# Patient Record
Sex: Female | Born: 1940 | Race: Black or African American | Hispanic: No | Marital: Married | Smoking: Never smoker
Health system: Southern US, Community
[De-identification: ages and names within clinical notes are randomized; demographics above are authoritative.]

## PROBLEM LIST (undated history)

## (undated) DIAGNOSIS — I10 Essential (primary) hypertension: Secondary | ICD-10-CM

## (undated) DIAGNOSIS — E785 Hyperlipidemia, unspecified: Secondary | ICD-10-CM

## (undated) HISTORY — DX: Essential (primary) hypertension: I10

## (undated) HISTORY — DX: Hyperlipidemia, unspecified: E78.5

---
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
## 2010-08-30 ENCOUNTER — Other Ambulatory Visit: Payer: Self-pay | Admitting: Internal Medicine

## 2010-08-30 DIAGNOSIS — Z1231 Encounter for screening mammogram for malignant neoplasm of breast: Secondary | ICD-10-CM

## 2010-08-30 DIAGNOSIS — Z780 Asymptomatic menopausal state: Secondary | ICD-10-CM

## 2010-09-07 ENCOUNTER — Ambulatory Visit
Admission: RE | Admit: 2010-09-07 | Discharge: 2010-09-07 | Disposition: A | Discharge status: Home / Self Care | Payer: Medicare Other | Source: Ambulatory Visit | Attending: Internal Medicine | Admitting: Internal Medicine

## 2010-09-07 DIAGNOSIS — Z780 Asymptomatic menopausal state: Secondary | ICD-10-CM

## 2010-09-07 DIAGNOSIS — Z1231 Encounter for screening mammogram for malignant neoplasm of breast: Secondary | ICD-10-CM

## 2014-05-17 DIAGNOSIS — E780 Pure hypercholesterolemia: Secondary | ICD-10-CM | POA: Diagnosis not present

## 2014-05-17 DIAGNOSIS — E559 Vitamin D deficiency, unspecified: Secondary | ICD-10-CM | POA: Diagnosis not present

## 2014-05-17 DIAGNOSIS — E119 Type 2 diabetes mellitus without complications: Secondary | ICD-10-CM | POA: Diagnosis not present

## 2014-05-17 DIAGNOSIS — I10 Essential (primary) hypertension: Secondary | ICD-10-CM | POA: Diagnosis not present

## 2014-09-13 DIAGNOSIS — E559 Vitamin D deficiency, unspecified: Secondary | ICD-10-CM | POA: Diagnosis not present

## 2014-09-13 DIAGNOSIS — E119 Type 2 diabetes mellitus without complications: Secondary | ICD-10-CM | POA: Diagnosis not present

## 2014-09-13 DIAGNOSIS — E780 Pure hypercholesterolemia: Secondary | ICD-10-CM | POA: Diagnosis not present

## 2014-09-13 DIAGNOSIS — M259 Joint disorder, unspecified: Secondary | ICD-10-CM | POA: Diagnosis not present

## 2014-09-13 DIAGNOSIS — I10 Essential (primary) hypertension: Secondary | ICD-10-CM | POA: Diagnosis not present

## 2015-01-10 DIAGNOSIS — I10 Essential (primary) hypertension: Secondary | ICD-10-CM | POA: Diagnosis not present

## 2015-01-10 DIAGNOSIS — Z23 Encounter for immunization: Secondary | ICD-10-CM | POA: Diagnosis not present

## 2015-01-10 DIAGNOSIS — E559 Vitamin D deficiency, unspecified: Secondary | ICD-10-CM | POA: Diagnosis not present

## 2015-01-10 DIAGNOSIS — E781 Pure hyperglyceridemia: Secondary | ICD-10-CM | POA: Diagnosis not present

## 2015-01-10 DIAGNOSIS — E119 Type 2 diabetes mellitus without complications: Secondary | ICD-10-CM | POA: Diagnosis not present

## 2015-05-09 DIAGNOSIS — Z0001 Encounter for general adult medical examination with abnormal findings: Secondary | ICD-10-CM | POA: Diagnosis not present

## 2015-11-17 DIAGNOSIS — H04123 Dry eye syndrome of bilateral lacrimal glands: Secondary | ICD-10-CM | POA: Diagnosis not present

## 2015-11-17 DIAGNOSIS — H2513 Age-related nuclear cataract, bilateral: Secondary | ICD-10-CM | POA: Diagnosis not present

## 2015-11-17 DIAGNOSIS — H25013 Cortical age-related cataract, bilateral: Secondary | ICD-10-CM | POA: Diagnosis not present

## 2015-11-17 DIAGNOSIS — H40023 Open angle with borderline findings, high risk, bilateral: Secondary | ICD-10-CM | POA: Diagnosis not present

## 2015-11-17 DIAGNOSIS — H40033 Anatomical narrow angle, bilateral: Secondary | ICD-10-CM | POA: Diagnosis not present

## 2015-12-05 DIAGNOSIS — H40033 Anatomical narrow angle, bilateral: Secondary | ICD-10-CM | POA: Diagnosis not present

## 2015-12-05 DIAGNOSIS — H25813 Combined forms of age-related cataract, bilateral: Secondary | ICD-10-CM | POA: Diagnosis not present

## 2016-01-23 DIAGNOSIS — M2550 Pain in unspecified joint: Secondary | ICD-10-CM | POA: Diagnosis not present

## 2016-01-23 DIAGNOSIS — E559 Vitamin D deficiency, unspecified: Secondary | ICD-10-CM | POA: Diagnosis not present

## 2016-01-23 DIAGNOSIS — I10 Essential (primary) hypertension: Secondary | ICD-10-CM | POA: Diagnosis not present

## 2016-01-23 DIAGNOSIS — E781 Pure hyperglyceridemia: Secondary | ICD-10-CM | POA: Diagnosis not present

## 2016-01-23 DIAGNOSIS — E119 Type 2 diabetes mellitus without complications: Secondary | ICD-10-CM | POA: Diagnosis not present

## 2016-07-31 ENCOUNTER — Other Ambulatory Visit: Payer: Self-pay | Admitting: Internal Medicine

## 2016-07-31 DIAGNOSIS — E2839 Other primary ovarian failure: Secondary | ICD-10-CM

## 2016-07-31 DIAGNOSIS — Z1231 Encounter for screening mammogram for malignant neoplasm of breast: Secondary | ICD-10-CM

## 2016-08-19 ENCOUNTER — Ambulatory Visit
Admission: RE | Admit: 2016-08-19 | Discharge: 2016-08-19 | Disposition: A | Discharge status: Home / Self Care | Payer: Medicare Other | Source: Ambulatory Visit | Attending: Internal Medicine | Admitting: Internal Medicine

## 2016-08-19 DIAGNOSIS — Z1231 Encounter for screening mammogram for malignant neoplasm of breast: Secondary | ICD-10-CM

## 2016-08-19 DIAGNOSIS — E2839 Other primary ovarian failure: Secondary | ICD-10-CM

## 2018-07-09 ENCOUNTER — Ambulatory Visit: Payer: Medicare Other | Admitting: *Deleted

## 2018-07-14 ENCOUNTER — Encounter: Payer: Medicare Other | Attending: Family Medicine | Admitting: Registered"

## 2018-07-14 ENCOUNTER — Encounter: Payer: Self-pay | Admitting: Registered"

## 2018-07-14 DIAGNOSIS — E1165 Type 2 diabetes mellitus with hyperglycemia: Secondary | ICD-10-CM | POA: Insufficient documentation

## 2018-07-14 NOTE — Patient Instructions (Addendum)
To help you work through your feelings of grief, consider talking to a therapist.  Review the Foot Care handout to ensure you are doing proper care.  Continue getting outside to walk as tolerated. When the weather doesn't permit going outside consider dancing inside or trying some of the arm chair exercises.  Aim to eat balanced meals and snacks. You can use the snack sheet for ideas. Remember to include protein when eating fruit, cottage cheese tastes good with pineapple and other fruits.  Good plan to change what you drink to mostly water. Most of the other things you are currently drink have a lot of sugar and are probably causing your blood sugar to spike. Instead of cranberry juice you can take cranberry pills to do the same thing for urinary health.  Consider checking your blood sugar 2 hours after some meals to give your doctor good information when managing your medications.   In the book you are writing your meals in, also include your blood sugar readings to help you see how you might make some changes to help manage your blood sugar.

## 2018-07-14 NOTE — Progress Notes (Signed)
This visit was completed via telephone due to the COVID-19 pandemic.   I spoke with Sherry Hanson and verified that I was speaking with the correct person with two patient identifiers (full name and date of birth).   I discussed the limitations related to this kind of visit and the patient is willing to proceed.  Diabetes Self-Management Education  Visit Type: First/Initial  Appt. Start Time: 1530 Appt. End Time: 1630  07/14/2018  [name], identified by name and date of birth, is a 78 y.o. female with a diagnosis of Diabetes: Type 2.   ASSESSMENT  There were no vitals taken for this visit. There is no height or weight on file to calculate BMI.   Pt states she was concerned about taking the pravastatin Sodium because she thought it was a salt pill and she would get too much sodium.  Pt checks BG sometimes 45 min PPBG 170 and before bed recently was 200 mg/dL. Pt states she wore a CGM for awhile and it revealed highest BG is after lunch meal.  Pt states she was not aware lemonade and cranberry juice were  ehigh in sugar and states she intends to switch to water now.   Pt states she is not hungry in the morning, has coffee while watching TV and then has bowl of cereal with banana. Pt states her appetite is not good sometimes and doesn't know what she wants to eat and drinks pickle juice to increase appetite.   Pt states her church sends food out to her to help during [place] stay at home orders. Pt states she just eats what they send and feels it has too much salt. Pt states they also give her fruit & vegetable boxes. Pt states before COVID-19 her neighbor who does catering often shares leftovers with her. Pt states she quit eating ice cream and french fries.  Pt states she has dentures but doesn't use them for chewing because they don't fit right. Pt states she is able to eat most foods without teeth as long as it is cut up.  Stress: Pt states she is afraid of everything with  COVID-19. Pt states she tries to deal with things herself but finds it hard to deal with grief with so many friends and family dying. Pt states she still has her sisters and talks to them.   Pt states she has a cataract in one eye but afraid to have it operated on.  Pt states she drinks cranberry juice for both taste and to help prevent UTI. Pt states she also takes AZO products sometimes. I told her they have a variety of products.  Activity: walks to store, plans to increase days per week getting out. Pt states she really likes to dance, will do at home, church, house parties.  Diabetes Self-Management Education - 07/14/18 1539      Visit Information   Visit Type  First/Initial      Initial Visit   Diabetes Type  Type 2    Are you currently following a meal plan?  No    Are you taking your medications as prescribed?  Yes   lantus 70 u, actos   Date Diagnosed  1992      Health Coping   How would you rate your overall health?  Fair      Psychosocial Assessment   Patient Belief/Attitude about Diabetes  Other (comment)   afraid of everything   How often do you need to have someone  help you when you read instructions, pamphlets, or other written materials from your doctor or pharmacy?  1 - Never    What is the last grade level you completed in school?  some college      Complications   Last HgB A1C per patient/outside source  13.6 %   05/15/2018 per referral   How often do you check your blood sugar?  1-2 times/day    Number of hypoglycemic episodes per month  0    Have you had a dilated eye exam in the past 12 months?  Yes    Have you had a dental exam in the past 12 months?  No   has dentures   Are you checking your feet?  Yes    How many days per week are you checking your feet?  7      Dietary Intake   Breakfast  coffee, cream & 1 T sugar, honey nut cheerios, bananas   9   Lunch  tuna fish OR BLT, baked beans, cole slaw, mac cheese, lemonade     Snack (afternoon)  lance  cheese crackers OR fruit    Dinner  lamb chops, salad, baked potato & beets    Snack (evening)  cheese crackers    Beverage(s)  water, tea, coffee, cranberry      Exercise   Exercise Type  Light (walking / raking leaves)    How many days per week to you exercise?  3    How many minutes per day do you exercise?  30    Total minutes per week of exercise  90      Patient Education   Previous Diabetes Education  No    Nutrition management   Role of diet in the treatment of diabetes and the relationship between the three main macronutrients and blood glucose level;Other (comment)   amt of sugar in beverages   Physical activity and exercise   Role of exercise on diabetes management, blood pressure control and cardiac health.    Monitoring  Purpose and frequency of SMBG.    Chronic complications  Assessed and discussed foot care and prevention of foot problems    Psychosocial adjustment  Role of stress on diabetes      Individualized Goals (developed by patient)   Nutrition  General guidelines for healthy choices and portions discussed    Physical Activity  Exercise 3-5 times per week    Monitoring   test my blood glucose as discussed    Health Coping  ask for help with (comment)   processing grief      Outcomes   Expected Outcomes  Demonstrated interest in learning. Expect positive outcomes    Future DMSE  4-6 wks    Program Status  Completed       Individualized Plan for Diabetes Self-Management Training:   Learning Objective:  Patient will have a greater understanding of diabetes self-management. Patient education plan is to attend individual and/or group sessions per assessed needs and concerns.   Patient Instructions  To help you work through your feelings of grief, consider talking to a therapist. Review the Foot Care handout to ensure you are doing proper care. Continue getting outside to walk as tolerated. When the weather doesn't permit going outside consider dancing  inside or trying some of the arm chair exercises. Aim to eat balanced meals and snacks. You can use the snack sheet for ideas. Remember to include protein when eating fruit, cottage cheese tastes good  with pineapple and other fruits. Good plan to change what you drink to mostly water. Most of the other things you are currently drink have a lot of sugar and are probably causing your blood sugar to spike.  Consider checking your blood sugar 2 hours after some meals to give your doctor good information when managing your medications.  In the book you are writing your meals in, also include your blood sugar readings to help you see how you might make some changes to help manage your blood sugar.   Expected Outcomes:  Demonstrated interest in learning. Expect positive outcomes  Education material provided: My Plate and Snack sheet, foot care, arm chair exercises  If problems or questions, patient to contact team via:  Phone  Future DSME appointment: 4-6 wks

## 2018-10-27 ENCOUNTER — Other Ambulatory Visit (HOSPITAL_COMMUNITY): Payer: Self-pay | Admitting: Family Medicine

## 2018-10-27 DIAGNOSIS — R6889 Other general symptoms and signs: Secondary | ICD-10-CM

## 2018-10-28 ENCOUNTER — Other Ambulatory Visit: Payer: Self-pay

## 2018-10-28 ENCOUNTER — Ambulatory Visit (HOSPITAL_COMMUNITY)
Admission: RE | Admit: 2018-10-28 | Discharge: 2018-10-28 | Disposition: A | Discharge status: Home / Self Care | Payer: Medicare Other | Source: Ambulatory Visit | Attending: Family | Admitting: Family

## 2018-10-28 DIAGNOSIS — R6889 Other general symptoms and signs: Secondary | ICD-10-CM | POA: Diagnosis not present

## 2018-12-10 ENCOUNTER — Encounter: Payer: Self-pay | Admitting: Vascular Surgery

## 2018-12-10 ENCOUNTER — Ambulatory Visit: Payer: Medicare Other | Admitting: Vascular Surgery

## 2018-12-10 ENCOUNTER — Other Ambulatory Visit: Payer: Self-pay

## 2018-12-10 VITALS — BP 118/74 | HR 74 | Temp 97.5°F | Resp 18 | Ht 65.5 in | Wt 137.0 lb

## 2018-12-10 DIAGNOSIS — I739 Peripheral vascular disease, unspecified: Secondary | ICD-10-CM

## 2018-12-10 NOTE — Progress Notes (Signed)
Referring Physician: Dr Juluis Rainier  Patient name: CHRISTINE MORTON MRN: [id number] DOB: Nov 03, 1940 Sex: female  REASON FOR CONSULT: Abnormal pulse exam  HPI: Sherry Hanson is a 78 y.o. female, sent for evaluation of an abnormal pulse exam.  She apparently had an abnormal pulse exam by her home health nurse and bilateral ABIs were obtained.  These were done a few weeks ago.  ABI on the right was 0.9 left was 0.8 this was dated October 28, 2018.  Patient does not have any symptoms of claudication.  She has no nonhealing wounds.  She is essentially asymptomatic.  Chronic medical problems including hyperlipidemia hypertension are both stable.  She is a non-smoker.     Past Medical History:  Diagnosis Date  . Hyperlipidemia   . Hypertension    Past Surgical History:  Procedure Laterality Date  . BREAST EXCISIONAL BIOPSY Right 1961    Family History  Problem Relation Age of Onset  . Breast cancer Neg Hx     SOCIAL HISTORY: Social History   Socioeconomic History  . Marital status: Married    Spouse name: Not on file  . Number of children: Not on file  . Years of education: Not on file  . Highest education level: Not on file  Occupational History  . Not on file  Social Needs  . Financial resource strain: Not on file  . Food insecurity    Worry: Not on file    Inability: Not on file  . Transportation needs    Medical: Not on file    Non-medical: Not on file  Tobacco Use  . Smoking status: Never Smoker  . Smokeless tobacco: Never Used  Substance and Sexual Activity  . Alcohol use: Not Currently  . Drug use: Never  . Sexual activity: Not on file  Lifestyle  . Physical activity    Days per week: Not on file    Minutes per session: Not on file  . Stress: Not on file  Relationships  . Social Musician on phone: Not on file    Gets together: Not on file    Attends religious service: Not on file    Active member of club or organization: Not on file    Attends meetings of clubs or organizations: Not on file    Relationship status: Not on file  . Intimate partner violence    Fear of current or ex partner: Not on file    Emotionally abused: Not on file    Physically abused: Not on file    Forced sexual activity: Not on file  Other Topics Concern  . Not on file  Social History Narrative  . Not on file    No Known Allergies  Current Outpatient Medications  Medication Sig Dispense Refill  . amLODipine (NORVASC) 10 MG tablet Take 10 mg by mouth daily.    Marland Kitchen aspirin EC 81 MG tablet Take 81 mg by mouth daily.    . Atorvastatin Calcium (LIPITOR PO) Take by mouth.    . Ergocalciferol (VITAMIN D2) 50 MCG (2000 UT) TABS Take by mouth.    . hydrochlorothiazide (HYDRODIURIL) 25 MG tablet Take 25 mg by mouth daily.    . Insulin Glargine (LANTUS [place]) Inject 70 Units into the skin.    Marland Kitchen LISINOPRIL PO Take by mouth.    . metoprolol tartrate (LOPRESSOR) 50 MG tablet Take 50 mg by mouth 2 (two) times daily.    . pioglitazone (ACTOS)  30 MG tablet Take 30 mg by mouth daily.     No current facility-administered medications for this visit.     ROS:   General:  No weight loss, Fever, chills  HEENT: No recent headaches, no nasal bleeding, no visual changes, no sore throat  Neurologic: No dizziness, blackouts, seizures. No recent symptoms of stroke or mini- stroke. No recent episodes of slurred speech, or temporary blindness.  Cardiac: No recent episodes of chest pain/pressure, no shortness of breath at rest.  No shortness of breath with exertion.  Denies history of atrial fibrillation or irregular heartbeat  Vascular: No history of rest pain in feet.  No history of claudication.  No history of non-healing ulcer, No history of DVT   Pulmonary: No home oxygen, no productive cough, no hemoptysis,  No asthma or wheezing  Musculoskeletal:  [ ]  Arthritis, [ ]  Low back pain,  [ ]  Joint pain  Hematologic:No history of hypercoagulable state.  No history  of easy bleeding.  No history of anemia  Gastrointestinal: No hematochezia or melena,  No gastroesophageal reflux, no trouble swallowing  Urinary: [ ]  chronic Kidney disease, [ ]  on HD - [ ]  MWF or [ ]  TTHS, [ ]  Burning with urination, [ ]  Frequent urination, [ ]  Difficulty urinating;   Skin: No rashes  Psychological: No history of anxiety,  No history of depression   Physical Examination  Vitals:   12/10/18 1329  BP: 118/74  Pulse: 74  Resp: 18  Temp: (!) 97.5 F (36.4 C)  SpO2: 95%  Weight: 137 lb (62.1 kg)  Height: 5' 5.5" (1.664 m)    Body mass index is 22.45 kg/m.  General:  Alert and oriented, no acute distress HEENT: Normal Neck: No JVD Pulmonary: Clear to auscultation bilaterally Cardiac: Regular Rate and Rhythm  Abdomen: Soft, non-tender, non-distended, no mass Skin: No rash Extremity Pulses:  2+ radial, brachial, femoral, popliteal 2+ left absent right dorsalis pedis, absent posterior tibial pulses bilaterally Musculoskeletal: No deformity or edema  Neurologic: Upper and lower extremity motor 5/5 and symmetric  DATA:  ABIs as mentioned per history of present illness  ASSESSMENT: Bilateral mild peripheral arterial disease, asymptomatic   PLAN: Patient will follow-up on an as-needed basis if she develops claudication symptoms or nonhealing wounds at some point in future.  Continue current risk factor modification with aspirin statin.   Ruta Hinds, MD [facility] Office: [phone] Pager: [phone]

## 2019-05-21 ENCOUNTER — Other Ambulatory Visit: Payer: Self-pay | Admitting: Family Medicine

## 2019-05-21 DIAGNOSIS — E2839 Other primary ovarian failure: Secondary | ICD-10-CM

## 2019-05-21 DIAGNOSIS — Z1231 Encounter for screening mammogram for malignant neoplasm of breast: Secondary | ICD-10-CM

## 2019-08-05 ENCOUNTER — Ambulatory Visit
Admission: RE | Admit: 2019-08-05 | Discharge: 2019-08-05 | Disposition: A | Discharge status: Home / Self Care | Payer: Medicare Other | Source: Ambulatory Visit | Attending: Family Medicine | Admitting: Family Medicine

## 2019-08-05 ENCOUNTER — Other Ambulatory Visit: Payer: Self-pay

## 2019-08-05 DIAGNOSIS — E2839 Other primary ovarian failure: Secondary | ICD-10-CM

## 2019-08-05 DIAGNOSIS — Z1231 Encounter for screening mammogram for malignant neoplasm of breast: Secondary | ICD-10-CM

## 2020-06-07 ENCOUNTER — Telehealth: Payer: Self-pay

## 2020-06-07 NOTE — Telephone Encounter (Signed)
Spoke with patient's son Amada Jupiter and scheduled an in-person Palliative Consult for 06/16/20 @ 12:30PM  COVID screening was negative. No pets in home. Patient lives alone.  Consent obtained; updated Outlook/Netsmart/Team List and Epic.  Family is aware they may be receiving a call from NP the day before or day of to confirm appointment.

## 2020-06-16 ENCOUNTER — Other Ambulatory Visit: Payer: Self-pay | Admitting: Student

## 2020-06-16 ENCOUNTER — Other Ambulatory Visit: Payer: Self-pay

## 2020-06-16 DIAGNOSIS — Z515 Encounter for palliative care: Secondary | ICD-10-CM

## 2020-06-16 DIAGNOSIS — R634 Abnormal weight loss: Secondary | ICD-10-CM

## 2020-06-16 DIAGNOSIS — E1165 Type 2 diabetes mellitus with hyperglycemia: Secondary | ICD-10-CM

## 2020-06-16 NOTE — Progress Notes (Signed)
[facility] Pall[facility] Consult Note Telephone: [phone]  Fax: [phone]    Date of encounter: 06/16/20 PATIENT NAME: Sherry Hanson [id number]   [phone] (home) [phone] (work) DOB: 1940/04/14 MRN: [id number] PRIMARY CARE PROVIDER:    Dr. Radene Ou  REFERRING PROVIDER:   Dr. Radene Ou  RESPONSIBLE PARTY:    Contact Information    Name Relation Home Work Mobile   [place] Sister [phone]         I met face to face with patient in the home. Palliative Care was asked to follow this patient by consultation request of  Dr. Radene Ou to address advance care planning and complex medical decision making. This is the initial visit.                                     ASSESSMENT AND PLAN / RECOMMENDATIONS:   Advance Care Planning/Goals of Care: Goals include to maximize quality of life and symptom management. Our advance care planning conversation included a discussion about:     The value and importance of advance care planning   Experiences with loved ones who have been seriously ill or have died   Exploration of personal, cultural or spiritual beliefs that might influence medical decisions   Exploration of goals of care in the event of a sudden injury or illness   Identification and preparation of a healthcare agent   MOST form filled out; Attempt CPR, Full scope of treatment, antibiotics, IV fluids as indicated, feeding tube.   CODE STATUS: Full Code  Education provided on Palliative Medicine vs Hospice services. Patient acknowledges needing more assistance. She states that she would be open to Hospice services if eligible. NP will review patient with Hospice Medical director to determine eligibility. Palliative SW will also be referred to for resources in the home, current living arrangements.   Symptom Management/Plan:  Abnormal weight loss/protein calorie malnutrition-BMI 17, 40 pound  weight loss in the past 6 months; albumin 3.9. Recommend balanced diet, encouraged to eat foods she enjoys. Continue nutritional shake 1-2 daily, Meals on Wheels.   T2DM-diabetes  uncontrolled. Most recent hemoglobin A1C 10.6. Patient with [facility]; continue to check blood sugars twice daily. Education provided on insulin and medication compliance. Continue Lantus 10 units QHS, Humalog 10 units TID before meals; patient currently taking once daily before breakfast. Education provided on diet, carbohydrate, sweets. Follow up with Dr. Garnet Koyanagi as scheduled.     Follow up Palliative Care Visit: Palliative care will continue to follow for complex medical decision making, advance care planning, and clarification of goals. Return in 4 weeks or prn.  I spent 60 minutes providing this consultation. More than 50% of the time in this consultation was spent in counseling and care coordination.    PPS: 40%  HOSPICE ELIGIBILITY/DIAGNOSIS: TBD  Chief Complaint: Palliative Medicine initial consult, weight loss.  HISTORY OF PRESENT ILLNESS:  Sherry Hanson is a 80 y.o. year old female  Abnormal weight loss, malnutrition, uncontrolled T2DM,   Patient resides at home; several other people in the home. She denies pain, chest pain, shortness of breath, headaches, nausea, vomiting, constipation. She states she has been taking her medications as prescribed. She has prepacked medications, although medications dated on 4/11 today had not been taken yet. She states she is taking her insulin. She states she is taking the Lantus 10 units QHS  and Humalog 10 units before breakfast; although orders state TID. She has freestyle Libre and checks blood sugar usually at least twice daily. Most recent blood sugars: 256, 242, 128, 324, 214, 247, 258, 277, 290, 252, 282. Breakfast is her best meal. She is drinking premier shakes. Receiving Meals on Wheels. She uses quad cane for ambulation. No recent falls or injury.  Continent of bowel and bladder. Endorses sleeping well at night. Completed HH with [facility] in January. Hemoglobin A1C 15.4 05/2019, 10.6 04/2020.    History obtained from review of EMR, discussion with primary team, and interview with family, facility staff/caregiver and/or Ms. Owens Shark.  I reviewed available labs, medications, imaging, studies and related documents from the EMR.  Records reviewed and summarized above.   ROS  General: NAD EYES: denies vision changes ENMT: denies dysphagia Cardiovascular: denies chest pain, denies DOE Pulmonary: denies cough, denies increased SOB Abdomen: endorses fair appetite, denies constipation, endorses continence of bowel GU: denies dysuria, endorses continence of urine MSK:  denies weakness,  no falls reported Skin: denies rashes or wounds Neurological: denies pain, denies insomnia Psych: Endorses positive mood Heme/lymph/immuno: denies bruises, abnormal bleeding  Physical Exam: Weight: 99 pounds Constitutional: NAD General: frail appearing, very thin EYES: anicteric sclera, lids intact, no discharge  ENMT: intact hearing, oral mucous membranes moist, dentition intact CV: S1S2, RRR, no LE edema Pulmonary: LCTA, no increased work of breathing, no cough, room air Abdomen: normo-active BS + 4 quadrants, soft and non tender GU: deferred MSK: severe sarcopenia, moves all extremities, ambulatory with quad cane Skin: warm and dry, no rashes or wounds on visible skin Neuro: generalized weakness,  no cognitive impairment Psych: non-anxious affect, A and O x 3 Hem/lymph/immuno: no widespread bruising   CURRENT PROBLEM LIST:  Patient Active Problem List   Diagnosis Date Noted  . Uncontrolled type 2 diabetes mellitus with hyperglycemia ([place]) 07/14/2018   PAST MEDICAL HISTORY:  Active Ambulatory Problems    Diagnosis Date Noted  . Uncontrolled type 2 diabetes mellitus with hyperglycemia ([place]) 07/14/2018   Resolved Ambulatory Problems     Diagnosis Date Noted  . No Resolved Ambulatory Problems   Past Medical History:  Diagnosis Date  . Hyperlipidemia   . Hypertension    SOCIAL HX:  Social History   Tobacco Use  . Smoking status: Never Smoker  . Smokeless tobacco: Never Used  Substance Use Topics  . Alcohol use: Not Currently   FAMILY HX:  Family History  Problem Relation Age of Onset  . Breast cancer Neg Hx      ALLERGIES: No Known Allergies   PERTINENT MEDICATIONS:  Outpatient Encounter Medications as of 06/16/2020  Medication Sig  . amLODipine (NORVASC) 10 MG tablet Take 10 mg by mouth daily.  Marland Kitchen aspirin EC 81 MG tablet Take 81 mg by mouth daily.  . Atorvastatin Calcium (LIPITOR PO) Take by mouth.  . Ergocalciferol (VITAMIN D2) 50 MCG (2000 UT) TABS Take by mouth.  . hydrochlorothiazide (HYDRODIURIL) 25 MG tablet Take 25 mg by mouth daily.  . Insulin Glargine (LANTUS [place]) Inject 70 Units into the skin.  Marland Kitchen LISINOPRIL PO Take by mouth.  . metoprolol tartrate (LOPRESSOR) 50 MG tablet Take 50 mg by mouth 2 (two) times daily.  . pioglitazone (ACTOS) 30 MG tablet Take 30 mg by mouth daily.   No facility-administered encounter medications on file as of 06/16/2020.    Thank you for the opportunity to participate in the care of Ms. Owens Shark.  The palliative care team will  continue to follow. Please call our office at [phone] if we can be of additional assistance.   Ezekiel Slocumb, NP   COVID-19 PATIENT SCREENING TOOL Asked and negative response unless otherwise noted:   Have you had symptoms of covid, tested positive or been in contact with someone with symptoms/positive test in the past 5-10 days? No

## 2022-03-20 IMAGING — MG DIGITAL SCREENING BILAT W/ CAD
4 series · 4 of 4 positions shown · non-contrast
Comparison: Previous exam(s).

CLINICAL DATA: Screening.

EXAM:
DIGITAL SCREENING BILATERAL MAMMOGRAM WITH CAD

[R MLO]
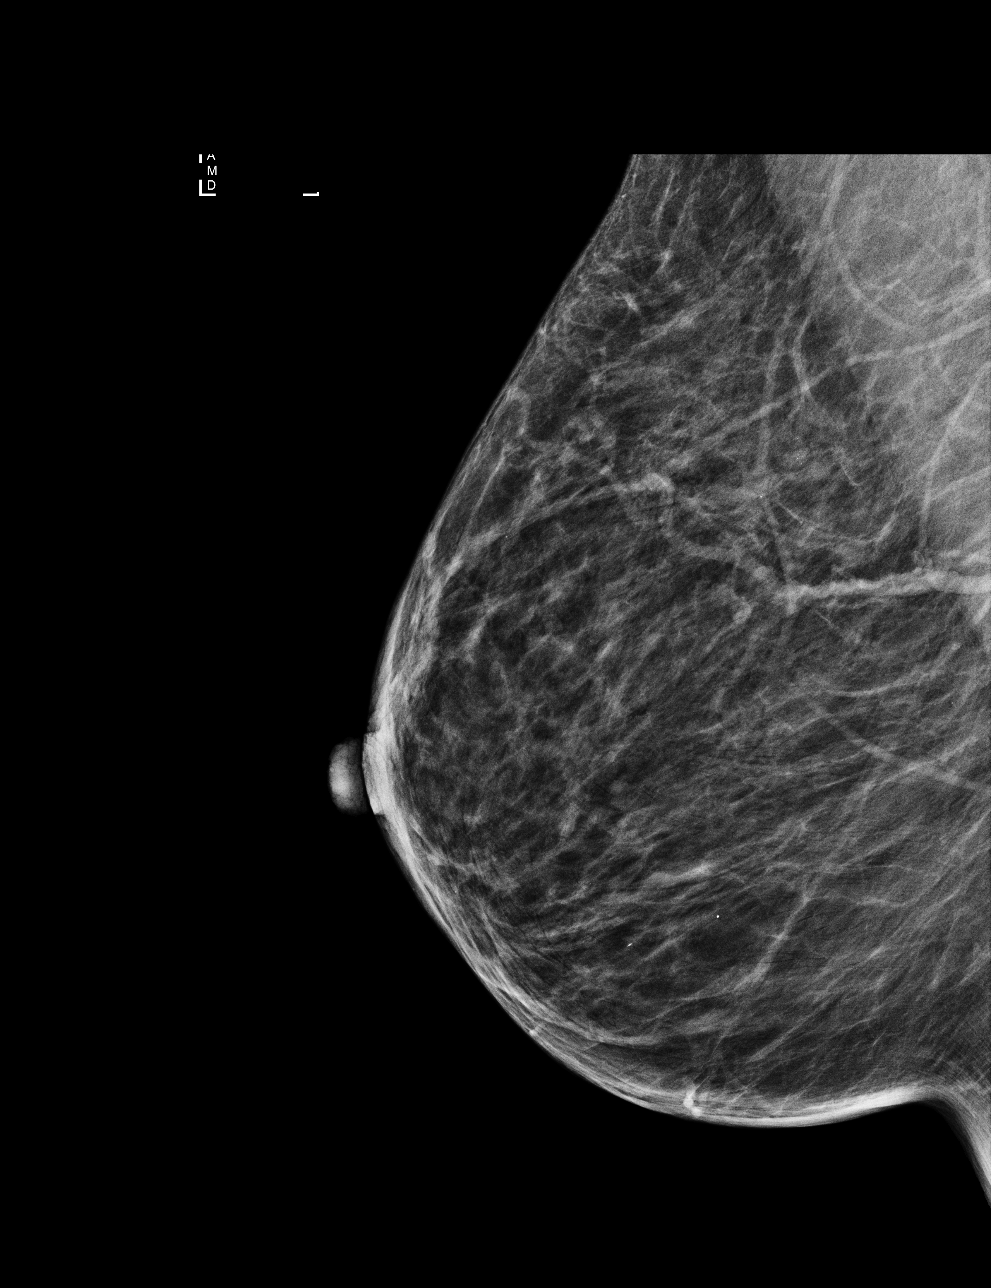

[L CC]
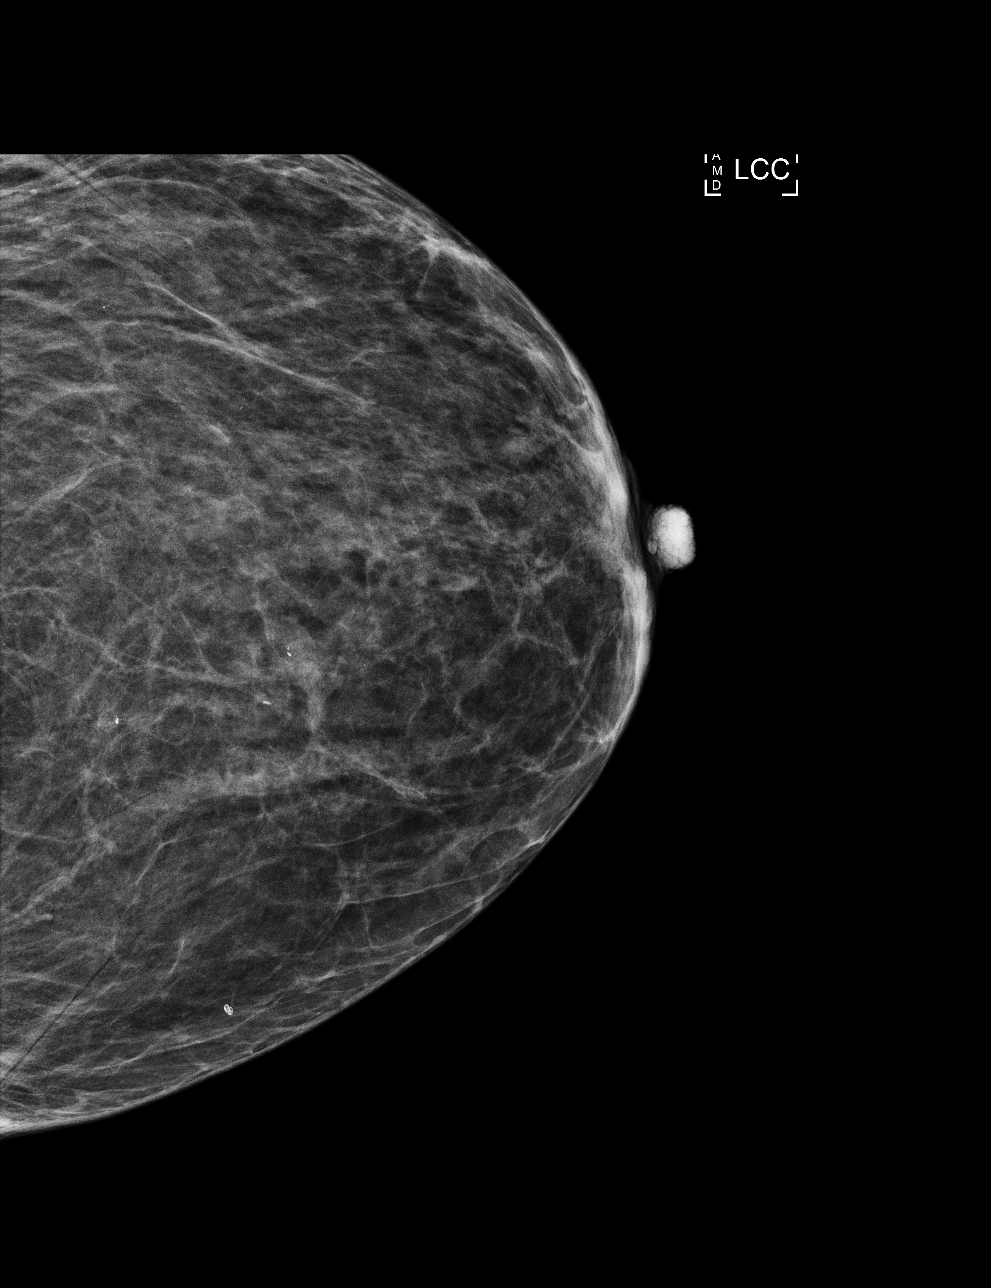

[R CC]
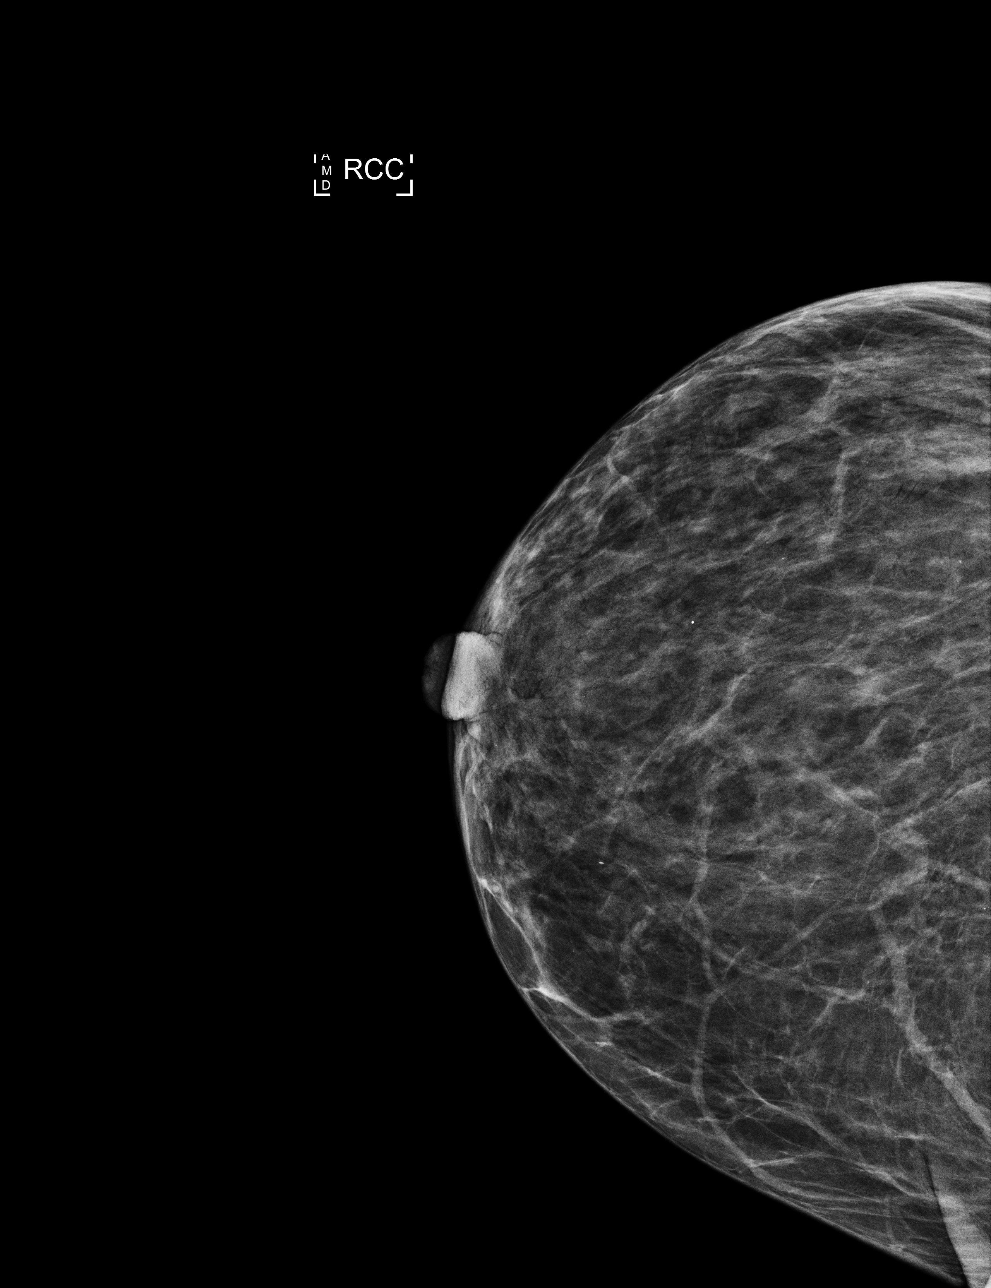

[L MLO]
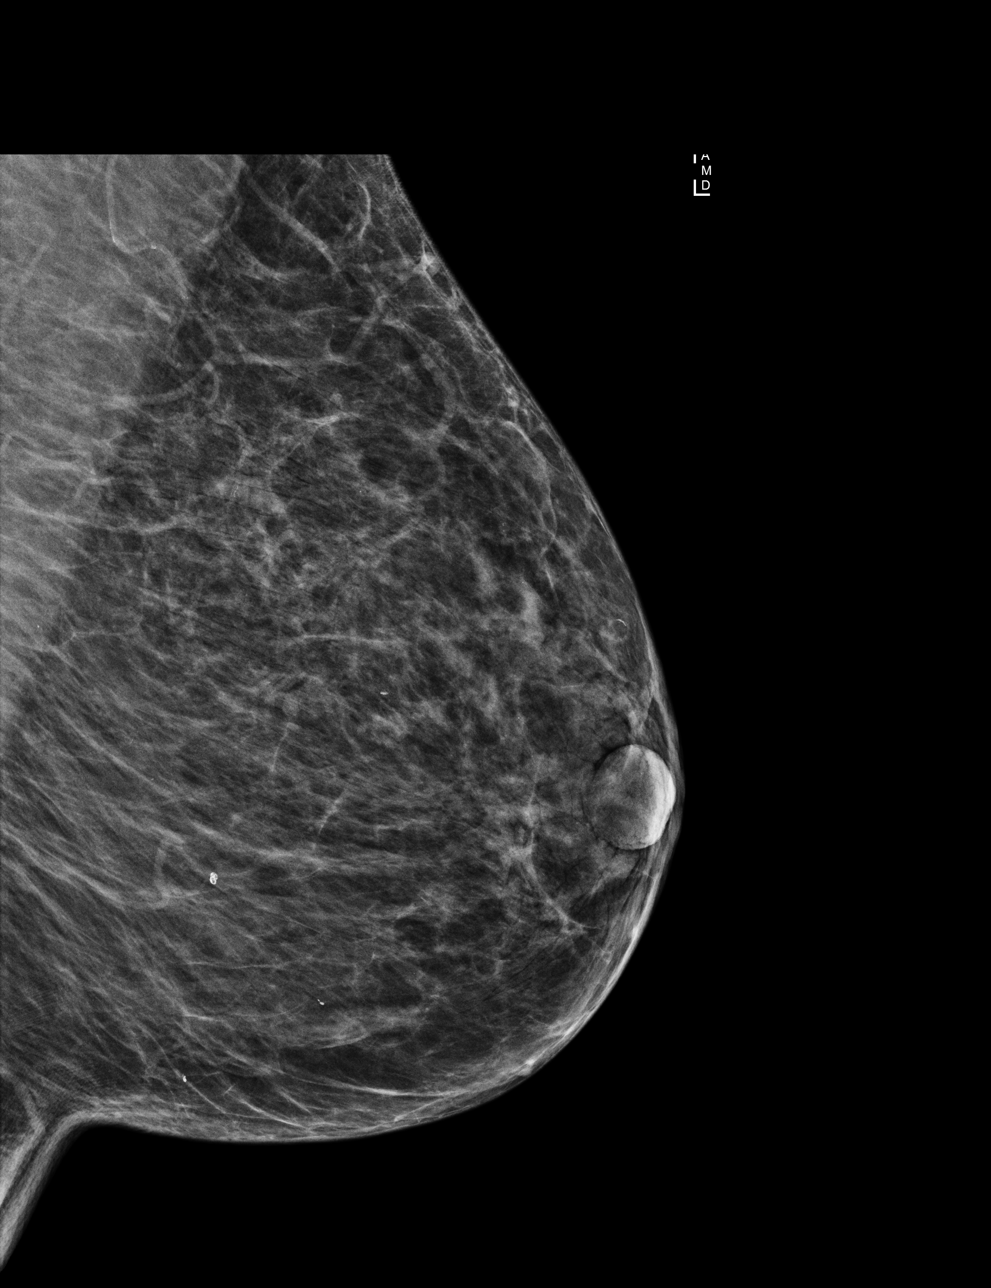

[4 of 4 positions shown; findings below may reference images not displayed]

ACR Breast Density Category b: There are scattered areas of
fibroglandular density.
FINDINGS: There are no findings suspicious for malignancy. Images were
processed with CAD.
IMPRESSION: No mammographic evidence of malignancy. A result letter of this
screening mammogram will be mailed directly to the patient.

RECOMMENDATION:
Screening mammogram in one year. (Code:AS-G-LCT)

BI-RADS CATEGORY  1: Negative.
